# Patient Record
Sex: Female | Born: 1950 | Race: Black or African American | Hispanic: No | Marital: Married | Smoking: Never smoker
Health system: Southern US, Community
[De-identification: ages and names within clinical notes are randomized; demographics above are authoritative.]

## PROBLEM LIST (undated history)

## (undated) DIAGNOSIS — G8929 Other chronic pain: Secondary | ICD-10-CM

## (undated) DIAGNOSIS — M1990 Unspecified osteoarthritis, unspecified site: Secondary | ICD-10-CM

## (undated) DIAGNOSIS — M549 Dorsalgia, unspecified: Secondary | ICD-10-CM

## (undated) DIAGNOSIS — K5909 Other constipation: Secondary | ICD-10-CM

## (undated) DIAGNOSIS — Z9889 Other specified postprocedural states: Secondary | ICD-10-CM

## (undated) DIAGNOSIS — F32A Depression, unspecified: Secondary | ICD-10-CM

## (undated) DIAGNOSIS — I10 Essential (primary) hypertension: Secondary | ICD-10-CM

## (undated) DIAGNOSIS — F329 Major depressive disorder, single episode, unspecified: Secondary | ICD-10-CM

## (undated) DIAGNOSIS — M3500 Sicca syndrome, unspecified: Secondary | ICD-10-CM

## (undated) DIAGNOSIS — R112 Nausea with vomiting, unspecified: Secondary | ICD-10-CM

## (undated) DIAGNOSIS — F419 Anxiety disorder, unspecified: Secondary | ICD-10-CM

## (undated) DIAGNOSIS — K219 Gastro-esophageal reflux disease without esophagitis: Secondary | ICD-10-CM

## (undated) DIAGNOSIS — G4700 Insomnia, unspecified: Secondary | ICD-10-CM

## (undated) HISTORY — PX: CHOLECYSTECTOMY: SHX55

## (undated) HISTORY — PX: TONSILLECTOMY: SUR1361

## (undated) HISTORY — PX: TUBAL LIGATION: SHX77

## (undated) HISTORY — PX: BUNIONECTOMY: SHX129

---

## 2002-04-27 HISTORY — PX: BREAST REDUCTION SURGERY: SHX8

## 2016-08-12 ENCOUNTER — Encounter (HOSPITAL_BASED_OUTPATIENT_CLINIC_OR_DEPARTMENT_OTHER): Payer: Self-pay | Admitting: *Deleted

## 2016-08-12 NOTE — Progress Notes (Signed)
Patient lives in [place], [place], she will need EKG dos. Pt had recent ECHO and she will bring a copy DOS.

## 2016-08-13 ENCOUNTER — Ambulatory Visit: Payer: Self-pay | Admitting: Plastic Surgery

## 2016-08-17 NOTE — Anesthesia Preprocedure Evaluation (Addendum)
Anesthesia Evaluation  Patient identified by MRN, date of birth, ID band Patient awake    Reviewed: Allergy & Precautions, H&P , Patient's Chart, lab work & pertinent test results, reviewed documented beta blocker date and time   Airway Mallampati: II  TM Distance: >3 FB Neck ROM: full    Dental no notable dental hx.    Pulmonary    Pulmonary exam normal breath sounds clear to auscultation       Cardiovascular hypertension,  Rhythm:regular Rate:Normal     Neuro/Psych    GI/Hepatic   Endo/Other    Renal/GU      Musculoskeletal   Abdominal   Peds  Hematology   Anesthesia Other Findings   Reproductive/Obstetrics                             Anesthesia Physical Anesthesia Plan  ASA: II  Anesthesia Plan: General   Post-op Pain Management:    Induction: Intravenous  Airway Management Planned: Oral ETT  Additional Equipment:   Intra-op Plan:   Post-operative Plan: Extubation in OR  Informed Consent: I have reviewed the patients History and Physical, chart, labs and discussed the procedure including the risks, benefits and alternatives for the proposed anesthesia with the patient or authorized representative who has indicated his/her understanding and acceptance.   Dental Advisory Given  Plan Discussed with: CRNA and Surgeon  Anesthesia Plan Comments: (  )        Anesthesia Quick Evaluation  

## 2016-08-18 ENCOUNTER — Ambulatory Visit (HOSPITAL_BASED_OUTPATIENT_CLINIC_OR_DEPARTMENT_OTHER): Payer: Federal, State, Local not specified - PPO | Admitting: Certified Registered"

## 2016-08-18 ENCOUNTER — Ambulatory Visit (HOSPITAL_BASED_OUTPATIENT_CLINIC_OR_DEPARTMENT_OTHER)
Admission: RE | Admit: 2016-08-18 | Discharge: 2016-08-18 | Disposition: A | Discharge status: Home / Self Care | Payer: Federal, State, Local not specified - PPO | Source: Ambulatory Visit | Attending: Plastic Surgery | Admitting: Plastic Surgery

## 2016-08-18 ENCOUNTER — Encounter (HOSPITAL_BASED_OUTPATIENT_CLINIC_OR_DEPARTMENT_OTHER): Payer: Self-pay | Admitting: Certified Registered"

## 2016-08-18 ENCOUNTER — Encounter (HOSPITAL_BASED_OUTPATIENT_CLINIC_OR_DEPARTMENT_OTHER)
Admission: RE | Disposition: A | Discharge status: Home / Self Care | Payer: Self-pay | Source: Ambulatory Visit | Attending: Plastic Surgery

## 2016-08-18 DIAGNOSIS — E7800 Pure hypercholesterolemia, unspecified: Secondary | ICD-10-CM | POA: Diagnosis not present

## 2016-08-18 DIAGNOSIS — Z9049 Acquired absence of other specified parts of digestive tract: Secondary | ICD-10-CM | POA: Insufficient documentation

## 2016-08-18 DIAGNOSIS — N62 Hypertrophy of breast: Secondary | ICD-10-CM | POA: Insufficient documentation

## 2016-08-18 DIAGNOSIS — Z79890 Hormone replacement therapy (postmenopausal): Secondary | ICD-10-CM | POA: Insufficient documentation

## 2016-08-18 DIAGNOSIS — N6011 Diffuse cystic mastopathy of right breast: Secondary | ICD-10-CM | POA: Diagnosis not present

## 2016-08-18 DIAGNOSIS — Z79899 Other long term (current) drug therapy: Secondary | ICD-10-CM | POA: Diagnosis not present

## 2016-08-18 DIAGNOSIS — Z7982 Long term (current) use of aspirin: Secondary | ICD-10-CM | POA: Diagnosis not present

## 2016-08-18 DIAGNOSIS — Z9889 Other specified postprocedural states: Secondary | ICD-10-CM | POA: Insufficient documentation

## 2016-08-18 DIAGNOSIS — N6012 Diffuse cystic mastopathy of left breast: Secondary | ICD-10-CM | POA: Insufficient documentation

## 2016-08-18 HISTORY — DX: Depression, unspecified: F32.A

## 2016-08-18 HISTORY — DX: Other constipation: K59.09

## 2016-08-18 HISTORY — DX: Anxiety disorder, unspecified: F41.9

## 2016-08-18 HISTORY — DX: Other specified postprocedural states: R11.2

## 2016-08-18 HISTORY — DX: Other specified postprocedural states: Z98.890

## 2016-08-18 HISTORY — DX: Dorsalgia, unspecified: M54.9

## 2016-08-18 HISTORY — DX: Other chronic pain: G89.29

## 2016-08-18 HISTORY — DX: Insomnia, unspecified: G47.00

## 2016-08-18 HISTORY — DX: Major depressive disorder, single episode, unspecified: F32.9

## 2016-08-18 HISTORY — DX: Essential (primary) hypertension: I10

## 2016-08-18 HISTORY — PX: BREAST REDUCTION SURGERY: SHX8

## 2016-08-18 HISTORY — DX: Gastro-esophageal reflux disease without esophagitis: K21.9

## 2016-08-18 HISTORY — DX: Sjogren syndrome, unspecified: M35.00

## 2016-08-18 HISTORY — DX: Unspecified osteoarthritis, unspecified site: M19.90

## 2016-08-18 SURGERY — BREAST REDUCTION WITH LIPOSUCTION
Anesthesia: General | Site: Breast | Laterality: Bilateral

## 2016-08-18 MED ORDER — LIDOCAINE 2% (20 MG/ML) 5 ML SYRINGE
INTRAMUSCULAR | Status: AC
  Filled 2016-08-18: qty 5

## 2016-08-18 MED ORDER — FENTANYL CITRATE (PF) 100 MCG/2ML IJ SOLN
50.0000 ug | INTRAMUSCULAR | Status: AC | PRN
  Administered 2016-08-18 (×3): 50 ug via INTRAVENOUS

## 2016-08-18 MED ORDER — PROPOFOL 10 MG/ML IV BOLUS
INTRAVENOUS | Status: DC | PRN
  Administered 2016-08-18: 200 mg via INTRAVENOUS

## 2016-08-18 MED ORDER — SCOPOLAMINE 1 MG/3DAYS TD PT72
1.0000 | MEDICATED_PATCH | Freq: Once | TRANSDERMAL | Status: DC
  Administered 2016-08-18: 1.5 mg via TRANSDERMAL

## 2016-08-18 MED ORDER — DEXAMETHASONE SODIUM PHOSPHATE 10 MG/ML IJ SOLN
INTRAMUSCULAR | Status: AC
  Filled 2016-08-18: qty 2

## 2016-08-18 MED ORDER — CHLORHEXIDINE GLUCONATE CLOTH 2 % EX PADS: 6.0000 | MEDICATED_PAD | Freq: Once | CUTANEOUS | Status: DC

## 2016-08-18 MED ORDER — CEFAZOLIN SODIUM-DEXTROSE 1-4 GM/50ML-% IV SOLN: 1.0000 g | Freq: Once | INTRAVENOUS | Status: DC

## 2016-08-18 MED ORDER — CEFAZOLIN SODIUM-DEXTROSE 2-4 GM/100ML-% IV SOLN
2.0000 g | INTRAVENOUS | Status: AC
  Administered 2016-08-18: 2 g via INTRAVENOUS

## 2016-08-18 MED ORDER — SUGAMMADEX SODIUM 200 MG/2ML IV SOLN
INTRAVENOUS | Status: DC | PRN
  Administered 2016-08-18: 300 mg via INTRAVENOUS

## 2016-08-18 MED ORDER — SCOPOLAMINE 1 MG/3DAYS TD PT72
MEDICATED_PATCH | TRANSDERMAL | Status: AC
  Filled 2016-08-18: qty 1

## 2016-08-18 MED ORDER — ONDANSETRON HCL 4 MG/2ML IJ SOLN
INTRAMUSCULAR | Status: DC | PRN
  Administered 2016-08-18: 4 mg via INTRAVENOUS

## 2016-08-18 MED ORDER — ROCURONIUM BROMIDE 10 MG/ML (PF) SYRINGE
PREFILLED_SYRINGE | INTRAVENOUS | Status: AC
  Filled 2016-08-18: qty 5

## 2016-08-18 MED ORDER — DEXAMETHASONE SODIUM PHOSPHATE 10 MG/ML IJ SOLN
INTRAMUSCULAR | Status: AC
  Filled 2016-08-18: qty 1

## 2016-08-18 MED ORDER — BUPIVACAINE HCL (PF) 0.25 % IJ SOLN
INTRAMUSCULAR | Status: AC
  Filled 2016-08-18: qty 60

## 2016-08-18 MED ORDER — CEFAZOLIN SODIUM-DEXTROSE 2-4 GM/100ML-% IV SOLN
INTRAVENOUS | Status: AC
  Filled 2016-08-18: qty 100

## 2016-08-18 MED ORDER — FENTANYL CITRATE (PF) 100 MCG/2ML IJ SOLN
INTRAMUSCULAR | Status: AC
  Filled 2016-08-18: qty 2

## 2016-08-18 MED ORDER — ONDANSETRON HCL 4 MG/2ML IJ SOLN
INTRAMUSCULAR | Status: AC
  Filled 2016-08-18: qty 8

## 2016-08-18 MED ORDER — BUPIVACAINE LIPOSOME 1.3 % IJ SUSP
INTRAMUSCULAR | Status: AC
  Filled 2016-08-18: qty 20

## 2016-08-18 MED ORDER — SODIUM CHLORIDE 0.9 % IV SOLN
INTRAVENOUS | Status: DC | PRN
  Administered 2016-08-18: 350 mL
  Administered 2016-08-18: 450 mL

## 2016-08-18 MED ORDER — HYDROMORPHONE HCL 1 MG/ML IJ SOLN
INTRAMUSCULAR | Status: AC
  Filled 2016-08-18: qty 1

## 2016-08-18 MED ORDER — LIDOCAINE HCL (CARDIAC) 20 MG/ML IV SOLN
INTRAVENOUS | Status: DC | PRN
  Administered 2016-08-18: 60 mg via INTRAVENOUS

## 2016-08-18 MED ORDER — BUPIVACAINE HCL (PF) 0.25 % IJ SOLN
INTRAMUSCULAR | Status: AC
  Filled 2016-08-18: qty 30

## 2016-08-18 MED ORDER — SUGAMMADEX SODIUM 500 MG/5ML IV SOLN
INTRAVENOUS | Status: AC
  Filled 2016-08-18: qty 5

## 2016-08-18 MED ORDER — BACITRACIN ZINC 500 UNIT/GM EX OINT
TOPICAL_OINTMENT | CUTANEOUS | Status: DC | PRN
  Administered 2016-08-18: 1 via TOPICAL

## 2016-08-18 MED ORDER — LIDOCAINE-EPINEPHRINE 1 %-1:100000 IJ SOLN
INTRAMUSCULAR | Status: AC
  Filled 2016-08-18: qty 1

## 2016-08-18 MED ORDER — MIDAZOLAM HCL 2 MG/2ML IJ SOLN
1.0000 mg | INTRAMUSCULAR | Status: DC | PRN
  Administered 2016-08-18: 1 mg via INTRAVENOUS

## 2016-08-18 MED ORDER — HYDROMORPHONE HCL 1 MG/ML IJ SOLN
0.2500 mg | INTRAMUSCULAR | Status: DC | PRN
  Administered 2016-08-18 (×2): 0.5 mg via INTRAVENOUS

## 2016-08-18 MED ORDER — LIDOCAINE HCL 1 % IJ SOLN
INTRAMUSCULAR | Status: AC
  Filled 2016-08-18: qty 40

## 2016-08-18 MED ORDER — DEXAMETHASONE SODIUM PHOSPHATE 4 MG/ML IJ SOLN
INTRAMUSCULAR | Status: DC | PRN
  Administered 2016-08-18: 10 mg via INTRAVENOUS

## 2016-08-18 MED ORDER — SCOPOLAMINE 1 MG/3DAYS TD PT72: 1.0000 | MEDICATED_PATCH | Freq: Once | TRANSDERMAL | Status: DC | PRN

## 2016-08-18 MED ORDER — ONDANSETRON HCL 4 MG/2ML IJ SOLN
INTRAMUSCULAR | Status: AC
  Filled 2016-08-18: qty 2

## 2016-08-18 MED ORDER — SODIUM CHLORIDE 0.9 % IV SOLN
INTRAVENOUS | Status: DC | PRN
  Administered 2016-08-18: 60 mL

## 2016-08-18 MED ORDER — LACTATED RINGERS IV SOLN
INTRAVENOUS | Status: DC
  Administered 2016-08-18 (×2): via INTRAVENOUS
  Administered 2016-08-18: 10 mL/h via INTRAVENOUS
  Administered 2016-08-18: 11:00:00 via INTRAVENOUS

## 2016-08-18 MED ORDER — BUPIVACAINE HCL (PF) 0.25 % IJ SOLN
INTRAMUSCULAR | Status: DC | PRN
  Administered 2016-08-18: 60 mL

## 2016-08-18 MED ORDER — PROPOFOL 500 MG/50ML IV EMUL
INTRAVENOUS | Status: AC
  Filled 2016-08-18: qty 50

## 2016-08-18 MED ORDER — BACITRACIN ZINC 500 UNIT/GM EX OINT
TOPICAL_OINTMENT | CUTANEOUS | Status: AC
  Filled 2016-08-18: qty 28.35

## 2016-08-18 MED ORDER — MIDAZOLAM HCL 2 MG/2ML IJ SOLN
INTRAMUSCULAR | Status: AC
  Filled 2016-08-18: qty 2

## 2016-08-18 MED ORDER — EPINEPHRINE 30 MG/30ML IJ SOLN
INTRAMUSCULAR | Status: AC
  Filled 2016-08-18: qty 1

## 2016-08-18 MED ORDER — ROCURONIUM BROMIDE 100 MG/10ML IV SOLN
INTRAVENOUS | Status: DC | PRN
  Administered 2016-08-18: 50 mg via INTRAVENOUS

## 2016-08-18 SURGICAL SUPPLY — 71 items
BAG DECANTER FOR FLEXI CONT (MISCELLANEOUS) ×3 IMPLANT
BENZOIN TINCTURE PRP APPL 2/3 (GAUZE/BANDAGES/DRESSINGS) ×6 IMPLANT
BLADE KNIFE PERSONA 10 (BLADE) ×12 IMPLANT
BLADE KNIFE PERSONA 15 (BLADE) ×9 IMPLANT
BNDG GAUZE ELAST 4 BULKY (GAUZE/BANDAGES/DRESSINGS) ×6 IMPLANT
CANISTER SUCT 1200ML W/VALVE (MISCELLANEOUS) ×3 IMPLANT
CAP BOUFFANT 24 BLUE NURSES (PROTECTIVE WEAR) ×3 IMPLANT
COVER BACK TABLE 60X90IN (DRAPES) ×3 IMPLANT
COVER MAYO STAND STRL (DRAPES) ×3 IMPLANT
DECANTER SPIKE VIAL GLASS SM (MISCELLANEOUS) ×9 IMPLANT
DRAIN CHANNEL 10F 3/8 F FF (DRAIN) ×6 IMPLANT
DRAPE LAPAROSCOPIC ABDOMINAL (DRAPES) ×3 IMPLANT
DRSG EMULSION OIL 3X3 NADH (GAUZE/BANDAGES/DRESSINGS) ×6 IMPLANT
DRSG PAD ABDOMINAL 8X10 ST (GAUZE/BANDAGES/DRESSINGS) ×6 IMPLANT
ELECT REM PT RETURN 9FT ADLT (ELECTROSURGICAL) ×3
ELECTRODE REM PT RTRN 9FT ADLT (ELECTROSURGICAL) ×2 IMPLANT
EVACUATOR SILICONE 100CC (DRAIN) ×6 IMPLANT
FILTER 7/8 IN (FILTER) ×3 IMPLANT
FILTER LIPOSUCTION (MISCELLANEOUS) ×3 IMPLANT
GAUZE SPONGE 4X4 12PLY STRL (GAUZE/BANDAGES/DRESSINGS) ×6 IMPLANT
GLOVE BIO SURGEON STRL SZ 6.5 (GLOVE) ×9 IMPLANT
GLOVE BIO SURGEON STRL SZ7.5 (GLOVE) ×3 IMPLANT
GLOVE BIOGEL PI IND STRL 6.5 (GLOVE) IMPLANT
GLOVE BIOGEL PI IND STRL 7.0 (GLOVE) ×10 IMPLANT
GLOVE BIOGEL PI INDICATOR 6.5 (GLOVE)
GLOVE BIOGEL PI INDICATOR 7.0 (GLOVE) ×5
GLOVE ECLIPSE 6.5 STRL STRAW (GLOVE) ×9 IMPLANT
GOWN STRL REUS W/ TWL LRG LVL3 (GOWN DISPOSABLE) ×8 IMPLANT
GOWN STRL REUS W/ TWL XL LVL3 (GOWN DISPOSABLE) IMPLANT
GOWN STRL REUS W/TWL LRG LVL3 (GOWN DISPOSABLE) ×4
GOWN STRL REUS W/TWL XL LVL3 (GOWN DISPOSABLE)
LINER CANISTER 1000CC FLEX (MISCELLANEOUS) ×6 IMPLANT
MARKER SKIN DUAL TIP RULER LAB (MISCELLANEOUS) ×3 IMPLANT
NDL SAFETY ECLIPSE 18X1.5 (NEEDLE) ×2 IMPLANT
NEEDLE HYPO 18GX1.5 SHARP (NEEDLE) ×1
NEEDLE HYPO 25X1 1.5 SAFETY (NEEDLE) ×3 IMPLANT
NEEDLE SPNL 18GX3.5 QUINCKE PK (NEEDLE) ×3 IMPLANT
NS IRRIG 1000ML POUR BTL (IV SOLUTION) ×6 IMPLANT
PACK BASIN DAY SURGERY FS (CUSTOM PROCEDURE TRAY) ×3 IMPLANT
PIN SAFETY STERILE (MISCELLANEOUS) ×3 IMPLANT
SCRUB TECHNI CARE 4 OZ NO DYE (MISCELLANEOUS) ×3 IMPLANT
SHEET MEDIUM DRAPE 40X70 STRL (DRAPES) ×9 IMPLANT
SLEEVE SCD COMPRESS KNEE MED (MISCELLANEOUS) ×3 IMPLANT
SPECIMEN JAR MEDIUM (MISCELLANEOUS) ×9 IMPLANT
SPECIMEN JAR X LARGE (MISCELLANEOUS) IMPLANT
SPONGE LAP 18X18 X RAY DECT (DISPOSABLE) ×12 IMPLANT
STAPLER VISISTAT 35W (STAPLE) ×6 IMPLANT
STRIP CLOSURE SKIN 1/2X4 (GAUZE/BANDAGES/DRESSINGS) ×12 IMPLANT
SUT ETHILON 3 0 PS 1 (SUTURE) ×3 IMPLANT
SUT MNCRL AB 3-0 PS2 18 (SUTURE) IMPLANT
SUT MNCRL AB 4-0 PS2 18 (SUTURE) ×6 IMPLANT
SUT MON AB 5-0 PS2 18 (SUTURE) ×6 IMPLANT
SUT PROLENE 2 0 CT2 30 (SUTURE) ×3 IMPLANT
SUT PROLENE 3 0 PS 1 (SUTURE) ×6 IMPLANT
SUT VLOC 180 0 24IN GS25 (SUTURE) IMPLANT
SUT VLOC 90 P-14 23 (SUTURE) ×9 IMPLANT
SYR 50ML LL SCALE MARK (SYRINGE) ×3 IMPLANT
SYR BULB IRRIGATION 50ML (SYRINGE) ×6 IMPLANT
SYR CONTROL 10ML LL (SYRINGE) ×9 IMPLANT
SYR TB 1ML LL NO SAFETY (SYRINGE) ×3 IMPLANT
TAPE MEASURE VINYL STERILE (MISCELLANEOUS) ×3 IMPLANT
TOWEL OR NON WOVEN STRL DISP B (DISPOSABLE) IMPLANT
TRAY DSU PREP LF (CUSTOM PROCEDURE TRAY) ×3 IMPLANT
TRAY FOLEY BAG SILVER LF 14FR (SET/KITS/TRAYS/PACK) IMPLANT
TRAY FOLEY BAG SILVER LF 16FR (SET/KITS/TRAYS/PACK) ×3 IMPLANT
TUBE CONNECTING 20X1/4 (TUBING) ×3 IMPLANT
TUBING INFILTRATION IT-10001 (TUBING) IMPLANT
TUBING SET GRADUATE ASPIR 12FT (MISCELLANEOUS) ×3 IMPLANT
UNDERPAD 30X30 (UNDERPADS AND DIAPERS) ×6 IMPLANT
VAC PENCILS W/TUBING CLEAR (MISCELLANEOUS) ×3 IMPLANT
YANKAUER SUCT BULB TIP NO VENT (SUCTIONS) ×3 IMPLANT

## 2016-08-18 NOTE — Brief Op Note (Signed)
08/18/2016  12:51 PM  PATIENT:  Emma Richardson  66 y.o. female  PRE-OPERATIVE DIAGNOSIS:  BILATERAL MACROMASTIA  POST-OPERATIVE DIAGNOSIS:  BILATERAL MACROMASTIA  PROCEDURE:  Procedure(s): BREAST REDUCTION WITH LIPOSUCTION (Bilateral)  SURGEON:  Surgeon(s) and Role:    * Eloise Levels, MD - Primary  ANESTHESIA:   general  EBL:  Total I/O In: 2000 [I.V.:2000] Out: 700 [Urine:550; Blood:150]  BLOOD ADMINISTERED:none  DRAINS: (40F) Jackson-Pratt drain(s) with closed bulb suction in the Bilateral breasts   LOCAL MEDICATIONS USED:  1.3% Exparel (total 266 mgs.)  SPECIMEN:  Source of Specimen:  Bilateral breasts  DISPOSITION OF SPECIMEN:  PATHOLOGY  COUNTS:  YES  DICTATION: .Note written in EPIC  PLAN OF CARE: Discharge to home after PACU  PATIENT DISPOSITION:  PACU - hemodynamically stable.   Delay start of Pharmacological VTE agent (>24hrs) due to surgical blood loss or risk of bleeding: not applicable

## 2016-08-18 NOTE — Anesthesia Postprocedure Evaluation (Signed)
Anesthesia Post Note  Patient: Emma Richardson  Procedure(s) Performed: Procedure(s) (LRB): BREAST REDUCTION WITH LIPOSUCTION (Bilateral)  Patient location during evaluation: PACU Anesthesia Type: General Level of consciousness: awake and alert Pain management: pain level controlled Vital Signs Assessment: post-procedure vital signs reviewed and stable Respiratory status: spontaneous breathing, nonlabored ventilation, respiratory function stable and patient connected to nasal cannula oxygen Cardiovascular status: blood pressure returned to baseline and stable Postop Assessment: no signs of nausea or vomiting Anesthetic complications: no       Last Vitals:  Vitals:   08/18/16 1400 08/18/16 1459  BP: 112/69 133/68  Pulse: 95 96  Resp: 16 20  Temp:  36.7 C    Last Pain:  Vitals:   08/18/16 1459  TempSrc: Oral  PainSc: 4                  Freddie Nghiem EDWARD

## 2016-08-18 NOTE — Op Note (Signed)
OPERATIVE REPORT  08/18/2016  Emma Richardson  PREOPERATIVE DIAGNOSIS:  Bilateral macromastia.  POSTOPERATIVE DIAGNOSIS:  Bilateral macromastia.  PROCEDURE:  Bilateral reduction mammoplasties   ATTENDING SURGEON:  Eloise Levels, MD  ANESTHESIA:  General.  ANESTHESIOLOGISTSherron Ales , MD  COMPLICATIONS:  None.  INDICATIONS FOR THE PROCEDURE:  The patient is a 66 y.o. 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  The patient has previously undergone a Bilateral breast reduction surgery. She presents to undergo bilateral reduction mammoplasties.  DESCRIPTION OF PROCEDURE:  The patient was marked in preop holding area in a pattern of Wise for the future bilateral reduction mammoplasties. She was then taken back to the OR, placed on the table in supine position.  After adequate general anesthesia was obtained, the patient's chest was prepped with Techni-Care and draped in sterile fashion.  The bases of the breasts have been infiltrated with 1% lidocaine with epinephrine.  After adequate hemostasis and anesthesia taken effect, the procedure was begun.  Both of the breast reductions were performed in the following similar manner.  The nipple-areolar complex was marked with a 45-mm nipple marker.  The skin was then incised and deepithelialized around the nipple-areolar complex down to the inframammary crease in the inferior pedicle pattern. The previous scars were included in the skin excision. Next, the medial, superior, and lateral skin flaps were elevated down to the chest wall.  Excess fat and glandular tissue removed from the inferior pedicle.  Tumescent fluid had been infiltrated along the lateral breast contours at the beginning of the breast dissection to prepare for the liposuction of these areas that was then performed as part of the breast reduction in order to prevent lengthening of scars. The amount of tumescent fluid infiltrated  is noted on the accompanying nurses's notes. The total amount of breast tissue removed by liposuction was recorded in the nursing notes and then the aspirated breast tissue was placed with the respective breast specimen container to be sent to pathology for examination. The nipple-areolar complex was examined and found to be pink and viable.  The wound was irrigated with saline irrigation.  Meticulous hemostasis was obtained with the Bovie electrocautery.  Inferior pedicle was centralized using 3-0 Prolene suture.  A #10 JP flat fully fluted drain was placed into the wound. The skin flaps were brought together at the inverted T junction with a 2- 0 Prolene suture.  The incisions were stapled for temporary closure. The breasts compared and found to have good shape and symmetry.  The incisions were then closed from the medial aspect of the JP drain to the medial aspect of the [facility] incision by first placing a few 3-0 Monocryl sutures to tack together the dermal layer, and then both the dermal and cuticular layer were closed in a single layer using a 2-0 Quill PDO barbed suture.  Lateral to the JP drain incision was closed using 3-0 Monocryl in the dermal layer, followed by 3-0 Monocryl running intracuticular stitch on the skin.  The vertical limb of the Wise pattern was closed in the dermal layer using 3-0 Monocryl suture.  The patient was placed in the upright position.  The future location of the nipple-areolar complexes was marked on both breast mounds using the 42-mm nipple marker.  She was then placed back in the recumbent position.  Both of the nipple areolar complexes were brought out onto the breast mounds in the following similar manner.  The skin was incised as marked and  removed in full thickness into the subcutaneous tissues.  The nipple- areolar complex was examined, found to be pink and viable, then brought out through this aperture and sewn in place using 4-0 Monocryl in  the dermal layer, followed by 5-0 Monocryl running intracuticular stitch on the skin.  This 5-0 Monocryl suture was then brought down to close the cuticular layer of the vertical limb as well.  The JP drain was sewn in place using 3-0 nylon suture.  The pectoralis major muscle and fascia along with the breast and chest soft tissues had been infiltrated with 1% Exparel (total 266 mg).  Now the [facility] incision was also infiltrated with the Exparel in order to give the patient postoperative pain control.  The incisions were dressed with benzoin, Steri-Strips, and the nipples dressed with bacitracin ointment and Adaptic.  4x4s were placed over the incisions and ABD pads in the axillary areas.  The patient was placed into a light postoperative support bra.  There were no complications. The patient tolerated the procedure well.  The final needle, sponge counts were reported to be correct at the end of the case.  The patient was then recovered without complications.  Both the patient and her family were given proper postoperative wound care instructions. She was then discharged home in the care of her family in stable condition.  Follow up will be with me in a few days in the office.         Eloise Levels, M.D.  08/18/2016 12:54 PM

## 2016-08-18 NOTE — Discharge Instructions (Signed)
1. No lifting greater than 5 lbs with arms for 4 weeks. 2. Empty, strip, record and reactivate JP drains 3 times a day. 3. Percocet 5/325 mg tabs 1-2 tabs po q 4-6 hours prn pain- prescription given in office. 4. Duricef 1 tab po bid- prescription given in office. 5. Sterapred dose pack as directed- prescription given in office. 6. Follow-up appointment Friday in office.      Information for Discharge Teaching: EXPAREL (bupivacaine liposome injectable suspension)   Your surgeon gave you EXPAREL(bupivacaine) in your surgical incision to help control your pain after surgery.   EXPAREL is a local anesthetic that provides pain relief by numbing the tissue around the surgical site.  EXPAREL is designed to release pain medication over time and can control pain for up to 72 hours.  Depending on how you respond to EXPAREL, you may require less pain medication during your recovery.  Possible side effects:  Temporary loss of sensation or ability to move in the area where bupivacaine was injected.  Nausea, vomiting, constipation  Rarely, numbness and tingling in your mouth or lips, lightheadedness, or anxiety may occur.  Call your doctor right away if you think you may be experiencing any of these sensations, or if you have other questions regarding possible side effects.  Follow all other discharge instructions given to you by your surgeon or nurse. Eat a healthy diet and drink plenty of water or other fluids.  If you return to the hospital for any reason within 96 hours following the administration of EXPAREL, please inform your health care providers.       Post Anesthesia Home Care Instructions  Activity: Get plenty of rest for the remainder of the day. A responsible individual must stay with you for 24 hours following the procedure.  For the next 24 hours, DO NOT: -Drive a car -Advertising copywriter -Drink alcoholic beverages -Take any medication unless instructed by your  physician -Make any legal decisions or sign important papers.  Meals: Start with liquid foods such as gelatin or soup. Progress to regular foods as tolerated. Avoid greasy, spicy, heavy foods. If nausea and/or vomiting occur, drink only clear liquids until the nausea and/or vomiting subsides. Call your physician if vomiting continues.  Special Instructions/Symptoms: Your throat may feel dry or sore from the anesthesia or the breathing tube placed in your throat during surgery. If this causes discomfort, gargle with warm salt water. The discomfort should disappear within 24 hours.  If you had a scopolamine patch placed behind your ear for the management of post- operative nausea and/or vomiting:  1. The medication in the patch is effective for 72 hours, after which it should be removed.  Wrap patch in a tissue and discard in the trash. Wash hands thoroughly with soap and water. 2. You may remove the patch earlier than 72 hours if you experience unpleasant side effects which may include dry mouth, dizziness or visual disturbances. 3. Avoid touching the patch. Wash your hands with soap and water after contact with the patch.

## 2016-08-18 NOTE — Anesthesia Procedure Notes (Signed)
Procedure Name: Intubation Date/Time: 08/18/2016 7:53 AM Performed by: Amara Manalang D Pre-anesthesia Checklist: Patient identified, Emergency Drugs available, Suction available and Patient being monitored Patient Re-evaluated:Patient Re-evaluated prior to inductionOxygen Delivery Method: Circle system utilized Preoxygenation: Pre-oxygenation with 100% oxygen Intubation Type: IV induction Ventilation: Mask ventilation without difficulty Laryngoscope Size: Mac and 3 Grade View: Grade I Tube type: Oral Tube size: 7.0 mm Number of attempts: 1 Airway Equipment and Method: Stylet and Oral airway Placement Confirmation: ETT inserted through vocal cords under direct vision,  positive ETCO2 and breath sounds checked- equal and bilateral Secured at: 21 cm Tube secured with: Tape Dental Injury: Teeth and Oropharynx as per pre-operative assessment

## 2016-08-18 NOTE — Transfer of Care (Signed)
Immediate Anesthesia Transfer of Care Note  Patient: Emma Richardson  Procedure(s) Performed: Procedure(s): BREAST REDUCTION WITH LIPOSUCTION (Bilateral)  Patient Location: PACU  Anesthesia Type:General  Level of Consciousness: awake, alert , oriented and patient cooperative  Airway & Oxygen Therapy: Patient Spontanous Breathing and Patient connected to face mask oxygen  Post-op Assessment: Report given to RN and Post -op Vital signs reviewed and stable  Post vital signs: Reviewed and stable  Last Vitals:  Vitals:   08/18/16 0703  BP: (!) 146/82  Pulse: 83  Resp: 18  Temp: 36.7 C    Last Pain:  Vitals:   08/18/16 0703  TempSrc: Oral         Complications: No apparent anesthesia complications

## 2016-08-18 NOTE — H&P (Signed)
  H&P faxed to surgical center.  -History and Physical Reviewed  -Patient has been re-examined  -No change in the plan of care  CONTOGIANNIS,MARY A    

## 2016-08-19 ENCOUNTER — Encounter (HOSPITAL_BASED_OUTPATIENT_CLINIC_OR_DEPARTMENT_OTHER): Payer: Self-pay | Admitting: Plastic Surgery

## 2016-10-19 ENCOUNTER — Other Ambulatory Visit: Payer: Self-pay | Admitting: Plastic Surgery

## 2016-10-19 ENCOUNTER — Ambulatory Visit
Admission: RE | Admit: 2016-10-19 | Discharge: 2016-10-19 | Disposition: A | Discharge status: Home / Self Care | Payer: Federal, State, Local not specified - PPO | Source: Ambulatory Visit | Attending: Plastic Surgery | Admitting: Plastic Surgery

## 2016-10-19 DIAGNOSIS — T81718A Complication of other artery following a procedure, not elsewhere classified, initial encounter: Principal | ICD-10-CM

## 2016-10-19 DIAGNOSIS — I2699 Other pulmonary embolism without acute cor pulmonale: Secondary | ICD-10-CM

## 2016-10-19 MED ORDER — IOPAMIDOL (ISOVUE-370) INJECTION 76%
75.0000 mL | Freq: Once | INTRAVENOUS | Status: AC | PRN
  Administered 2016-10-19: 75 mL via INTRAVENOUS

## 2016-11-06 NOTE — Anesthesia Postprocedure Evaluation (Signed)
Anesthesia Post Note  Patient: Emma Richardson  Procedure(s) Performed: Procedure(s) (LRB): BREAST REDUCTION WITH LIPOSUCTION (Bilateral)     Anesthesia Post Evaluation  Last Vitals:  Vitals:   08/18/16 1400 08/18/16 1459  BP: 112/69 133/68  Pulse: 95 96  Resp: 16 20  Temp:  36.7 C    Last Pain:  Vitals:   08/19/16 0952  TempSrc:   PainSc: 0-No pain                 Nzinga Ferran EDWARD

## 2016-11-06 NOTE — Addendum Note (Signed)
Addendum  created 11/06/16 1144 by Cristela BlueJackson, Zaryiah Barz, MD   Sign clinical note

## 2016-12-11 ENCOUNTER — Encounter: Payer: Self-pay | Admitting: Plastic Surgery

## 2018-12-18 IMAGING — CT CT ANGIO CHEST
3 of 6 series · 11 of 30 positions shown · IV contrast (75CC ISOVUE 370)
Comparison: None.

CLINICAL DATA: 65-year-old who underwent bilateral reduction
mammoplasty approximately 2 months ago, presenting with a three-week
history of shortness of breath.

Creatinine was obtained on site at [HOSPITAL] at [REDACTED].
Results: Creatinine 0.6 mg/dL.  Estimated GFR 112.
EXAM:
CT ANGIOGRAPHY CHEST WITH CONTRAST
TECHNIQUE: Multidetector CT imaging of the chest was performed using the
standard protocol during bolus administration of intravenous
contrast. Multiplanar CT image reconstructions and MIPs were
obtained to evaluate the vascular anatomy.
CONTRAST:  75 mL Isovue 370 IV.

[Series 2: pe 1.25 · axial · 0.70mm/px · z∈[-212,-9]mm · 6 of 228 slices shown]
[im 33/228  lung]
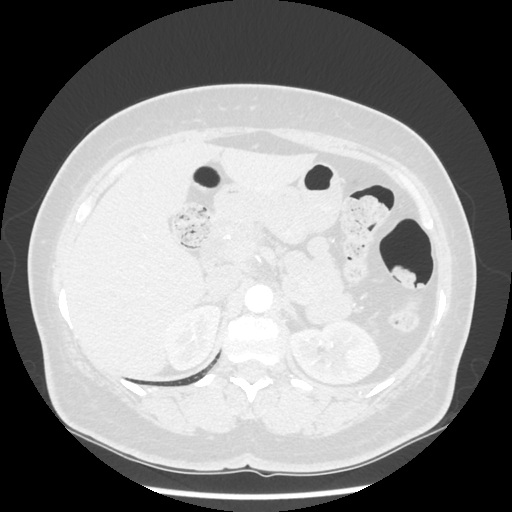
[im 65/228  mediastinal]
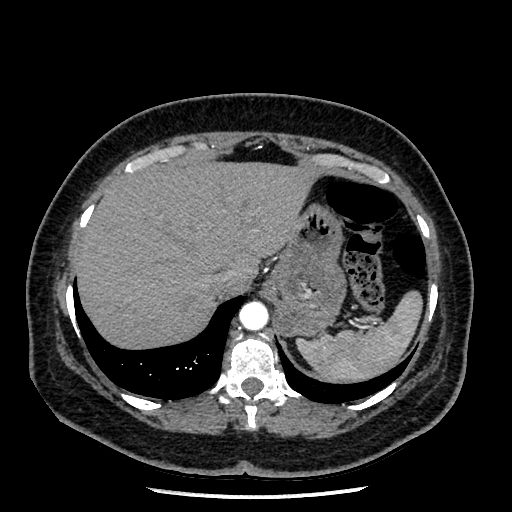
[im 98/228  lung]
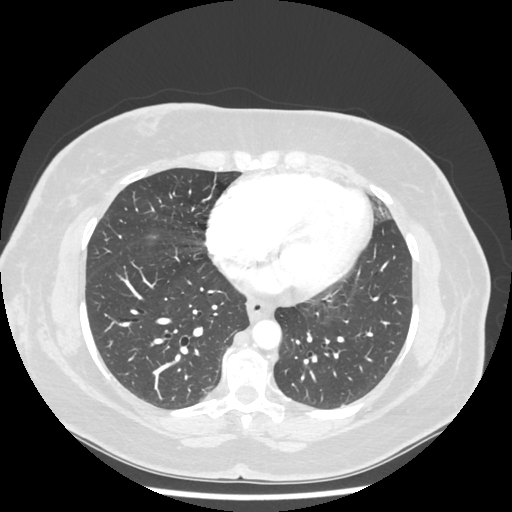
[im 130/228  mediastinal]
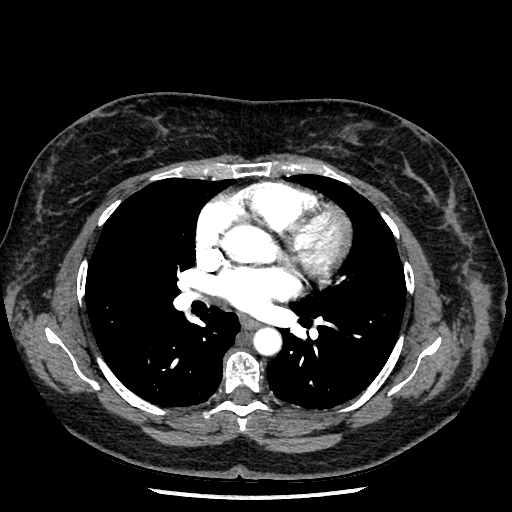
[im 163/228  lung]
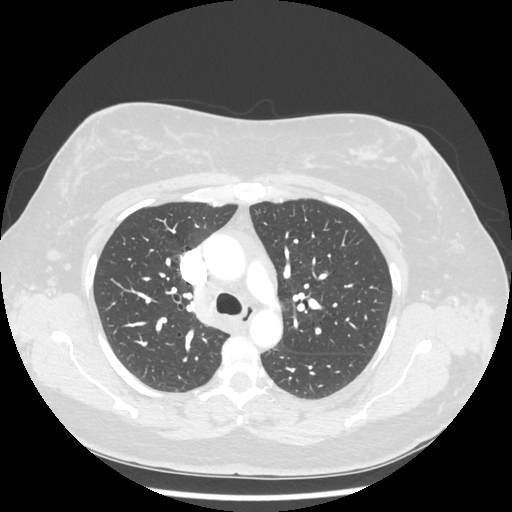
[im 195/228  mediastinal]
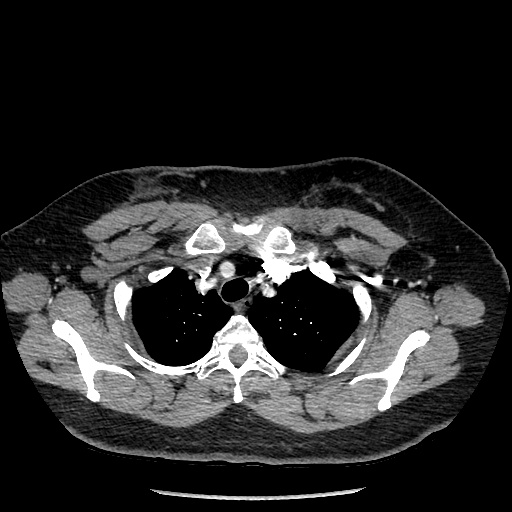

[Series 3: pe 2.5 · axial · 0.70mm/px · z∈[-158,-63]mm · 2 of 114 slices shown]
[im 38/114  lung]
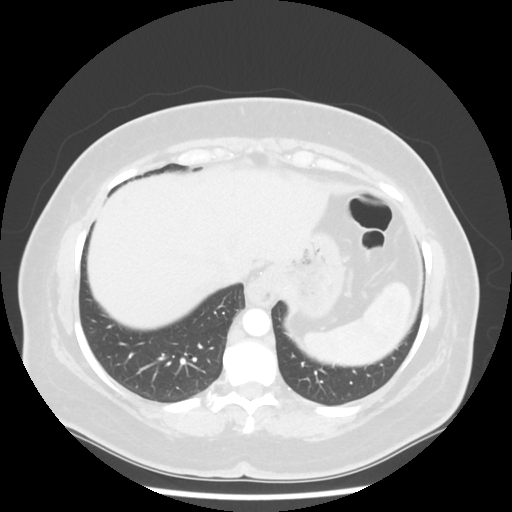
[im 76/114  lung]
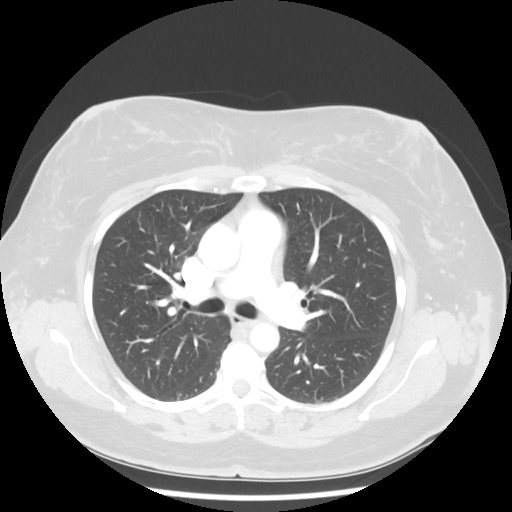

[Series 602: sagittal body · sagittal · 0.70mm/px · 3 of 145 slices shown]
[im 37/145  lung]
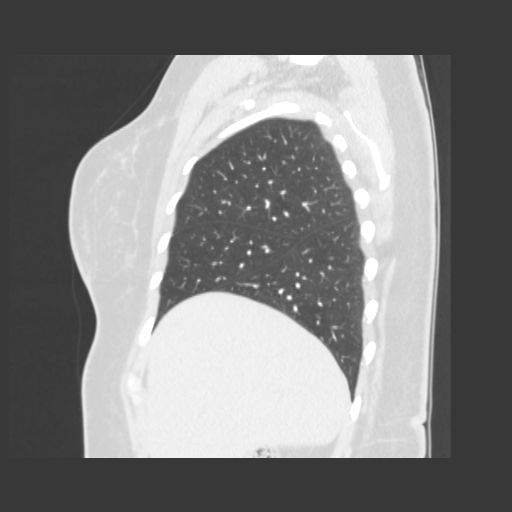
[im 73/145  lung]
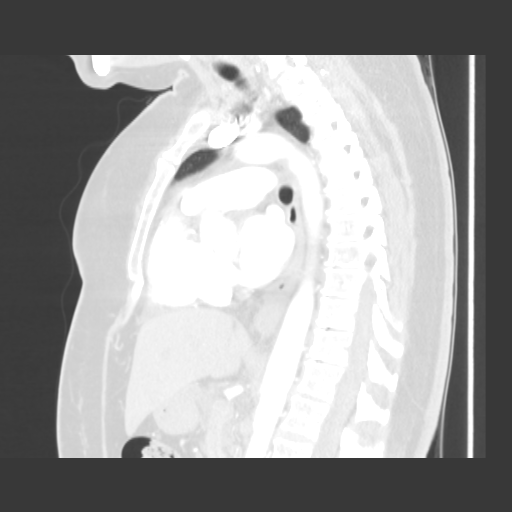
[im 109/145  lung]
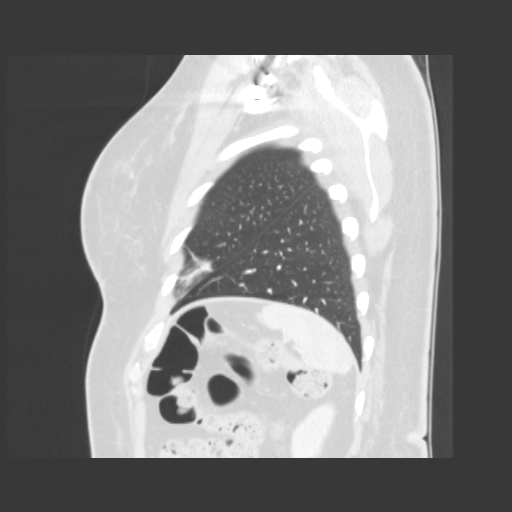

[11 of 30 positions shown; findings below may reference images not displayed]

FINDINGS: Cardiovascular: Contrast opacification of the pulmonary arteries is
very good. No filling defects within either main pulmonary artery or
their segmental branches in either lung to suggest pulmonary
embolism.

Cardiac silhouette mildly enlarged. Moderate LAD coronary
atherosclerosis. Small pericardial effusion. Mild atherosclerosis
involving the thoracic and upper abdominal aorta without evidence of
aneurysm.

Mediastinum/Nodes: No pathologically enlarged mediastinal, hilar or
axillary lymph nodes. No mediastinal masses. Normal-appearing
esophagus. Thyroid gland unremarkable.

Lungs/Pleura: Minimal scarring in the right middle lobe and lingula.
Pulmonary parenchyma otherwise clear without localized airspace
consolidation, interstitial disease, or parenchymal nodules or
masses. Central airways patent without significant bronchial wall
thickening. No pleural effusions. No pleural plaques or masses.

Upper Abdomen: Small hiatal hernia. Prior cholecystectomy. Multiple
hepatic cysts, the largest approximating 1.4 cm. Large stool burden
in the visualized colon.

Musculoskeletal/Chest Wall: Post surgical changes in both breasts
related to the recent reduction mammoplasties. Mild degenerative
disc disease and spondylosis diffusely throughout the thoracic
spine. No acute osseous abnormality.

Review of the MIP images confirms the above findings.
IMPRESSION: 1. No evidence of pulmonary embolism.
2.  No acute cardiopulmonary disease.
3. Moderate LAD coronary atherosclerosis. Mild thoracic aortic
atherosclerosis without aneurysm.
4. Small hiatal hernia.
5. Benign hepatic cysts.

Aortic Atherosclerosis ([id number]).
